# Patient Record
Sex: Female | Born: 1980 | Race: Black or African American | Hispanic: No | Marital: Single | Smoking: Never smoker
Health system: Southern US, Community
[De-identification: ages and names within clinical notes are randomized; demographics above are authoritative.]

---

## 2012-02-24 ENCOUNTER — Emergency Department (HOSPITAL_COMMUNITY)
Admission: EM | Admit: 2012-02-24 | Discharge: 2012-02-24 | Disposition: A | Discharge status: Home / Self Care | Payer: No Typology Code available for payment source | Attending: Emergency Medicine | Admitting: Emergency Medicine

## 2012-02-24 DIAGNOSIS — M542 Cervicalgia: Secondary | ICD-10-CM

## 2012-02-24 DIAGNOSIS — Y92410 Unspecified street and highway as the place of occurrence of the external cause: Secondary | ICD-10-CM | POA: Insufficient documentation

## 2012-02-24 DIAGNOSIS — H53149 Visual discomfort, unspecified: Secondary | ICD-10-CM | POA: Insufficient documentation

## 2012-02-24 DIAGNOSIS — S0993XA Unspecified injury of face, initial encounter: Secondary | ICD-10-CM | POA: Insufficient documentation

## 2012-02-24 DIAGNOSIS — S060X9A Concussion with loss of consciousness of unspecified duration, initial encounter: Secondary | ICD-10-CM

## 2012-02-24 DIAGNOSIS — Y9389 Activity, other specified: Secondary | ICD-10-CM | POA: Insufficient documentation

## 2012-02-24 DIAGNOSIS — S060XAA Concussion with loss of consciousness status unknown, initial encounter: Secondary | ICD-10-CM | POA: Insufficient documentation

## 2012-02-24 MED ORDER — IBUPROFEN 800 MG PO TABS: 800.0000 mg | ORAL_TABLET | Freq: Three times a day (TID) | ORAL | Status: AC

## 2012-02-24 MED ORDER — CYCLOBENZAPRINE HCL 10 MG PO TABS: 10.0000 mg | ORAL_TABLET | Freq: Two times a day (BID) | ORAL | Status: AC | PRN

## 2012-02-24 NOTE — ED Notes (Signed)
Pt states she has a ride home. 
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
## 2012-02-24 NOTE — ED Provider Notes (Signed)
Medical screening examination/treatment/procedure(s) were performed by non-physician practitioner and as supervising physician I was immediately available for consultation/collaboration.  Andyn Sales, MD 02/24/12 2345 
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
## 2019-04-23 ENCOUNTER — Encounter (HOSPITAL_COMMUNITY): Payer: Self-pay | Admitting: Emergency Medicine

## 2019-04-23 ENCOUNTER — Emergency Department (HOSPITAL_COMMUNITY): Payer: No Typology Code available for payment source

## 2019-04-23 ENCOUNTER — Emergency Department (HOSPITAL_COMMUNITY)
Admission: EM | Admit: 2019-04-23 | Discharge: 2019-04-24 | Disposition: A | Discharge status: Home / Self Care | Payer: No Typology Code available for payment source | Attending: Emergency Medicine | Admitting: Emergency Medicine

## 2019-04-23 ENCOUNTER — Other Ambulatory Visit: Payer: Self-pay

## 2019-04-23 DIAGNOSIS — M542 Cervicalgia: Secondary | ICD-10-CM

## 2019-04-23 DIAGNOSIS — R1084 Generalized abdominal pain: Secondary | ICD-10-CM | POA: Insufficient documentation

## 2019-04-23 DIAGNOSIS — Y998 Other external cause status: Secondary | ICD-10-CM | POA: Insufficient documentation

## 2019-04-23 DIAGNOSIS — Y9389 Activity, other specified: Secondary | ICD-10-CM | POA: Insufficient documentation

## 2019-04-23 DIAGNOSIS — M25532 Pain in left wrist: Secondary | ICD-10-CM | POA: Insufficient documentation

## 2019-04-23 DIAGNOSIS — M25561 Pain in right knee: Secondary | ICD-10-CM | POA: Diagnosis not present

## 2019-04-23 DIAGNOSIS — R079 Chest pain, unspecified: Secondary | ICD-10-CM | POA: Diagnosis present

## 2019-04-23 DIAGNOSIS — W2210XA Striking against or struck by unspecified automobile airbag, initial encounter: Secondary | ICD-10-CM | POA: Diagnosis not present

## 2019-04-23 DIAGNOSIS — Y92410 Unspecified street and highway as the place of occurrence of the external cause: Secondary | ICD-10-CM | POA: Insufficient documentation

## 2019-04-23 LAB — BASIC METABOLIC PANEL
Anion gap: 9 (ref 5–15)
BUN: 9 mg/dL (ref 6–20)
CO2: 26 mmol/L (ref 22–32)
Calcium: 9.4 mg/dL (ref 8.9–10.3)
Chloride: 103 mmol/L (ref 98–111)
Creatinine, Ser: 0.75 mg/dL (ref 0.44–1.00)
GFR calc Af Amer: >60 mL/min (ref >60)
GFR calc non Af Amer: >60 mL/min (ref >60)
Glucose, Bld: 89 mg/dL (ref 70–99)
Potassium: 3.1 mmol/L — ABNORMAL LOW (ref 3.5–5.1)
Sodium: 138 mmol/L (ref 135–145)

## 2019-04-23 LAB — CBC WITH DIFFERENTIAL/PLATELET
Abs Immature Granulocytes: 0.03 10*3/uL (ref 0.00–0.07)
Basophils Absolute: 0.1 10*3/uL (ref 0.0–0.1)
Basophils Relative: 0 %
Eosinophils Absolute: 0.1 10*3/uL (ref 0.0–0.5)
Eosinophils Relative: 1 %
HCT: 39.9 % (ref 36.0–46.0)
Hemoglobin: 13.4 g/dL (ref 12.0–15.0)
Immature Granulocytes: 0 %
Lymphocytes Relative: 43 %
Lymphs Abs: 5.4 10*3/uL — ABNORMAL HIGH (ref 0.7–4.0)
MCH: 33.3 pg (ref 26.0–34.0)
MCHC: 33.6 g/dL (ref 30.0–36.0)
MCV: 99.3 fL (ref 80.0–100.0)
Monocytes Absolute: 0.7 10*3/uL (ref 0.1–1.0)
Monocytes Relative: 6 %
Neutro Abs: 6.2 10*3/uL (ref 1.7–7.7)
Neutrophils Relative %: 50 %
Platelets: 312 10*3/uL (ref 150–400)
RBC: 4.02 MIL/uL (ref 3.87–5.11)
RDW: 14 % (ref 11.5–15.5)
WBC: 12.6 10*3/uL — ABNORMAL HIGH (ref 4.0–10.5)
nRBC: 0 % (ref 0.0–0.2)

## 2019-04-23 LAB — I-STAT BETA HCG BLOOD, ED (MC, WL, AP ONLY): I-stat hCG, quantitative: <5 m[IU]/mL (ref <5)

## 2019-04-23 MED ORDER — ONDANSETRON HCL 4 MG/2ML IJ SOLN
4.0000 mg | Freq: Once | INTRAMUSCULAR | Status: AC
  Administered 2019-04-23: 4 mg via INTRAVENOUS
  Filled 2019-04-23: qty 2

## 2019-04-23 MED ORDER — SODIUM CHLORIDE (PF) 0.9 % IJ SOLN
INTRAMUSCULAR | Status: AC
  Filled 2019-04-23: qty 50

## 2019-04-23 MED ORDER — FENTANYL CITRATE (PF) 100 MCG/2ML IJ SOLN
50.0000 ug | Freq: Once | INTRAMUSCULAR | Status: AC
  Administered 2019-04-23: 50 ug via INTRAVENOUS
  Filled 2019-04-23: qty 2

## 2019-04-23 MED ORDER — METHOCARBAMOL 500 MG PO TABS: 500.0000 mg | ORAL_TABLET | Freq: Two times a day (BID) | ORAL | 0 refills | Status: AC

## 2019-04-23 MED ORDER — IOHEXOL 300 MG/ML  SOLN
100.0000 mL | Freq: Once | INTRAMUSCULAR | Status: AC | PRN
  Administered 2019-04-24: 100 mL via INTRAVENOUS

## 2019-04-23 NOTE — ED Notes (Signed)
Pt given room phone and assisted with calling her husband.

## 2019-04-23 NOTE — Discharge Instructions (Addendum)
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
## 2019-04-23 NOTE — ED Notes (Signed)
Pt ambulatory to BR, urine specimen at bedside if needed.

## 2019-04-23 NOTE — ED Notes (Signed)
IV team at bedside 
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
## 2019-04-24 ENCOUNTER — Emergency Department (HOSPITAL_COMMUNITY): Payer: No Typology Code available for payment source
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
